# Patient Record
Sex: Male | Born: 1980
Health system: Southern US, Community
[De-identification: ages and names within clinical notes are randomized; demographics above are authoritative.]

## PROBLEM LIST (undated history)

## (undated) DIAGNOSIS — G4730 Sleep apnea, unspecified: Secondary | ICD-10-CM

---

## 2005-05-24 ENCOUNTER — Emergency Department: Payer: Self-pay | Admitting: Emergency Medicine

## 2008-01-19 ENCOUNTER — Emergency Department: Payer: Self-pay | Admitting: Emergency Medicine

## 2008-09-14 ENCOUNTER — Emergency Department: Payer: Self-pay | Admitting: Emergency Medicine

## 2010-10-19 ENCOUNTER — Emergency Department: Payer: Self-pay | Admitting: Emergency Medicine

## 2010-12-01 ENCOUNTER — Ambulatory Visit: Payer: Self-pay | Admitting: Internal Medicine

## 2015-05-17 ENCOUNTER — Emergency Department: Payer: Managed Care, Other (non HMO)

## 2015-05-17 ENCOUNTER — Emergency Department
Admission: EM | Admit: 2015-05-17 | Discharge: 2015-05-17 | Disposition: A | Discharge status: Home / Self Care | Payer: Managed Care, Other (non HMO) | Attending: Emergency Medicine | Admitting: Emergency Medicine

## 2015-05-17 ENCOUNTER — Encounter: Payer: Self-pay | Admitting: Emergency Medicine

## 2015-05-17 DIAGNOSIS — R05 Cough: Secondary | ICD-10-CM | POA: Diagnosis present

## 2015-05-17 DIAGNOSIS — J069 Acute upper respiratory infection, unspecified: Secondary | ICD-10-CM | POA: Insufficient documentation

## 2015-05-17 DIAGNOSIS — I10 Essential (primary) hypertension: Secondary | ICD-10-CM | POA: Insufficient documentation

## 2015-05-17 MED ORDER — GUAIFENESIN-CODEINE 100-10 MG/5ML PO SOLN: 10.0000 mL | ORAL | Status: DC | PRN

## 2015-05-17 MED ORDER — AZITHROMYCIN 250 MG PO TABS: ORAL_TABLET | ORAL | Status: DC

## 2015-05-17 NOTE — ED Provider Notes (Signed)
[facility] Emergency Department Provider Note  ____________________________________________  Time seen: Approximately 2:45 PM  I have reviewed the triage vital signs and the nursing notes.   HISTORY  Chief Complaint Cough and Nasal Congestion    HPI Brett Holmes is a 35 y.o. male recently finished a course of Amoxil for sinus infection and Fosamax for cough. Patient states that a couple days he started developing fevers with chills and coughing productively. Denies any body aches. Past medical history significant for hypertension not taking any medications this time. Symptoms seem to be results worsen when laying down and nothing seems to improve them.   History reviewed. No pertinent past medical history.  There are no active problems to display for this patient.   History reviewed. No pertinent past surgical history.  Current Outpatient Rx  Name  Route  Sig  Dispense  Refill  . azithromycin (ZITHROMAX Z-PAK) 250 MG tablet      Take 2 tablets (500 mg) on  Day 1,  followed by 1 tablet (250 mg) once daily on Days 2 through 5.   6 each   0   . guaiFENesin-codeine 100-10 MG/5ML syrup   Oral   Take 10 mLs by mouth every 4 (four) hours as needed for cough.   180 mL   0     Allergies Review of patient's allergies indicates no known allergies.  No family history on file.  Social History Social History  Substance Use Topics  . Smoking status: Never Smoker   . Smokeless tobacco: None  . Alcohol Use: Yes    Review of Systems Constitutional: Occasional fever/chills ENT: No sore throat. Cardiovascular: Denies chest pain. Respiratory: Patient's shortness of breath with cough productive. Gastrointestinal: No abdominal pain.  No nausea, no vomiting.  No diarrhea.  No constipation. Genitourinary: Negative for dysuria. Musculoskeletal: Negative for back pain. Skin: Negative for rash. Neurological: Negative for headaches, focal weakness or  numbness.  10-point ROS otherwise negative.  ____________________________________________   PHYSICAL EXAM:  VITAL SIGNS: ED Triage Vitals  Enc Vitals Group     BP 05/17/15 1401 159/98 mmHg     Pulse Rate 05/17/15 1401 113     Resp 05/17/15 1401 20     Temp 05/17/15 1401 98.4 F (36.9 C)     Temp Source 05/17/15 1401 Oral     SpO2 05/17/15 1401 97 %     Weight 05/17/15 1401 350 lb (158.759 kg)     Height 05/17/15 1401  (1.88 m)     Head Cir --      Peak Flow --      Pain Score 05/17/15 1402 3     Pain Loc --      Pain Edu? --      Excl. in GC? --     Constitutional: Alert and oriented. Well appearing and in no acute distress. Nose: Minimal congestion/rhinnorhea. Mouth/Throat: Mucous membranes are moist.  Oropharynx non-erythematous. Neck: No stridor.   Cardiovascular: Normal rate, regular rhythm. Grossly normal heart sounds.  Good peripheral circulation. Respiratory: Normal respiratory effort.  No retractions. Lungs CTAB. Decreased breath sounds bilaterally. May be due to poor inspiration effort. Musculoskeletal: No lower extremity tenderness nor edema.  No joint effusions. Neurologic:  Normal speech and language. No gross focal neurologic deficits are appreciated. No gait instability. Skin:  Skin is warm, dry and intact. No rash noted. Psychiatric: Mood and affect are normal. Speech and behavior are normal.  ____________________________________________   LABS (all labs  ordered are listed, but only abnormal results are displayed)  Labs Reviewed - No data to display ____________________________________________   RADIOLOGY  No acute cardiopulmonary disease. No evidence of atelectasis or pneumonia. ____________________________________________   PROCEDURES  Procedure(s) performed: None  Critical Care performed: No  ____________________________________________   INITIAL IMPRESSION / ASSESSMENT AND PLAN / ED COURSE  Pertinent labs & imaging results that  were available during my care of the patient were reviewed by me and considered in my medical decision making (see chart for details).  Acute upper respiratory infection. Rx given for Z-Pak, Robitussin-AC, patient to follow up PCP to monitor his elevated blood pressure which was discussed with him at length today. Patient voices no other emergency medical complaints at this time. Work excuse 48 hours given. ____________________________________________   FINAL CLINICAL IMPRESSION(S) / ED DIAGNOSES  Final diagnoses:  URI, acute     This chart was dictated using voice recognition software/Dragon. Despite best efforts to proofread, errors can occur which can change the meaning. Any change was purely unintentional.   Evangeline Dakin, PA-C 05/17/15 1540  Governor Rooks, MD 05/17/15 1626

## 2015-05-17 NOTE — Discharge Instructions (Signed)
Upper Respiratory Infection, Adult Most upper respiratory infections (URIs) are a viral infection of the air passages leading to the lungs. A URI affects the nose, throat, and upper air passages. The most common type of URI is nasopharyngitis and is typically referred to as "the common cold." URIs run their course and usually go away on their own. Most of the time, a URI does not require medical attention, but sometimes a bacterial infection in the upper airways can follow a viral infection. This is called a secondary infection. Sinus and middle ear infections are common types of secondary upper respiratory infections. Bacterial pneumonia can also complicate a URI. A URI can worsen asthma and chronic obstructive pulmonary disease (COPD). Sometimes, these complications can require emergency medical care and may be life threatening.  CAUSES Almost all URIs are caused by viruses. A virus is a type of germ and can spread from one person to another.  RISKS FACTORS You may be at risk for a URI if:   You smoke.   You have chronic heart or lung disease.  You have a weakened defense (immune) system.   You are very young or very old.   You have nasal allergies or asthma.  You work in crowded or poorly ventilated areas.  You work in health care facilities or schools. SIGNS AND SYMPTOMS  Symptoms typically develop 2-3 days after you come in contact with a cold virus. Most viral URIs last 7-10 days. However, viral URIs from the influenza virus (flu virus) can last 14-18 days and are typically more severe. Symptoms may include:   Runny or stuffy (congested) nose.   Sneezing.   Cough.   Sore throat.   Headache.   Fatigue.   Fever.   Loss of appetite.   Pain in your forehead, behind your eyes, and over your cheekbones (sinus pain).  Muscle aches.  DIAGNOSIS  Your health care provider may diagnose a URI by:  Physical exam.  Tests to check that your symptoms are not due to  another condition such as:  Strep throat.  Sinusitis.  Pneumonia.  Asthma. TREATMENT  A URI goes away on its own with time. It cannot be cured with medicines, but medicines may be prescribed or recommended to relieve symptoms. Medicines may help:  Reduce your fever.  Reduce your cough.  Relieve nasal congestion. HOME CARE INSTRUCTIONS   Take medicines only as directed by your health care provider.   Gargle warm saltwater or take cough drops to comfort your throat as directed by your health care provider.  Use a warm mist humidifier or inhale steam from a shower to increase air moisture. This may make it easier to breathe.  Drink enough fluid to keep your urine clear or pale yellow.   Eat soups and other clear broths and maintain good nutrition.   Rest as needed.   Return to work when your temperature has returned to normal or as your health care provider advises. You may need to stay home longer to avoid infecting others. You can also use a face mask and careful hand washing to prevent spread of the virus.  Increase the usage of your inhaler if you have asthma.   Do not use any tobacco products, including cigarettes, chewing tobacco, or electronic cigarettes. If you need help quitting, ask your health care provider. PREVENTION  The best way to protect yourself from getting a cold is to practice good hygiene.   Avoid oral or hand contact with people with cold   symptoms.   Wash your hands often if contact occurs.  There is no clear evidence that vitamin C, vitamin E, echinacea, or exercise reduces the chance of developing a cold. However, it is always recommended to get plenty of rest, exercise, and practice good nutrition.  SEEK MEDICAL CARE IF:   You are getting worse rather than better.   Your symptoms are not controlled by medicine.   You have chills.  You have worsening shortness of breath.  You have brown or red mucus.  You have yellow or brown nasal  discharge.  You have pain in your face, especially when you bend forward.  You have a fever.  You have swollen neck glands.  You have pain while swallowing.  You have white areas in the back of your throat. SEEK IMMEDIATE MEDICAL CARE IF:   You have severe or persistent:  Headache.  Ear pain.  Sinus pain.  Chest pain.  You have chronic lung disease and any of the following:  Wheezing.  Prolonged cough.  Coughing up blood.  A change in your usual mucus.  You have a stiff neck.  You have changes in your:  Vision.  Hearing.  Thinking.  Mood. MAKE SURE YOU:   Understand these instructions.  Will watch your condition.  Will get help right away if you are not doing well or get worse.   This information is not intended to replace advice given to you by your health care provider. Make sure you discuss any questions you have with your health care provider.   Document Released: 09/06/2000 Document Revised: 07/28/2014 Document Reviewed: 06/18/2013 Elsevier Interactive Patient Education 2016 [employer]  

## 2015-05-17 NOTE — ED Notes (Signed)
Pt presents with cough and congestion for one month, he is also wondering if he is allergic to his cats at home.

## 2015-05-17 NOTE — ED Notes (Signed)
States he has had congestion  Cough for several months   States sx's eased off and then returned about 2-3 weeks ago.  Congestion is now is chest

## 2015-10-25 ENCOUNTER — Emergency Department
Admission: EM | Admit: 2015-10-25 | Discharge: 2015-10-25 | Disposition: A | Discharge status: Home / Self Care | Payer: Worker's Compensation | Attending: Emergency Medicine | Admitting: Emergency Medicine

## 2015-10-25 ENCOUNTER — Emergency Department: Payer: Worker's Compensation

## 2015-10-25 ENCOUNTER — Encounter: Payer: Self-pay | Admitting: Emergency Medicine

## 2015-10-25 DIAGNOSIS — S60042A Contusion of left ring finger without damage to nail, initial encounter: Secondary | ICD-10-CM | POA: Diagnosis not present

## 2015-10-25 DIAGNOSIS — Y929 Unspecified place or not applicable: Secondary | ICD-10-CM | POA: Insufficient documentation

## 2015-10-25 DIAGNOSIS — W230XXA Caught, crushed, jammed, or pinched between moving objects, initial encounter: Secondary | ICD-10-CM | POA: Diagnosis not present

## 2015-10-25 DIAGNOSIS — S6992XA Unspecified injury of left wrist, hand and finger(s), initial encounter: Secondary | ICD-10-CM | POA: Diagnosis present

## 2015-10-25 DIAGNOSIS — Y939 Activity, unspecified: Secondary | ICD-10-CM | POA: Diagnosis not present

## 2015-10-25 DIAGNOSIS — S6000XA Contusion of unspecified finger without damage to nail, initial encounter: Secondary | ICD-10-CM

## 2015-10-25 DIAGNOSIS — Y990 Civilian activity done for income or pay: Secondary | ICD-10-CM | POA: Diagnosis not present

## 2015-10-25 HISTORY — DX: Sleep apnea, unspecified: G47.30

## 2015-10-25 MED ORDER — NAPROXEN 500 MG PO TABS
500.0000 mg | ORAL_TABLET | Freq: Once | ORAL | Status: AC
  Administered 2015-10-25: 500 mg via ORAL
  Filled 2015-10-25: qty 1

## 2015-10-25 MED ORDER — TRAMADOL HCL 50 MG PO TABS: 50.0000 mg | ORAL_TABLET | Freq: Once | ORAL | Status: DC

## 2015-10-25 MED ORDER — NAPROXEN 500 MG PO TABS: 500.0000 mg | ORAL_TABLET | Freq: Two times a day (BID) | ORAL | 0 refills | Status: DC

## 2015-10-25 NOTE — Discharge Instructions (Signed)
Wear finger splint for 2-3 days as needed. °

## 2015-10-25 NOTE — ED Notes (Signed)
Pt reports his finger got crushed in between 2 rollers in a machine at work at approx 2000.

## 2015-10-25 NOTE — ED Notes (Signed)
workmans comp urine drug screen done and walked to lab.

## 2015-10-25 NOTE — ED Provider Notes (Signed)
[facility] Emergency Department Provider Note   ____________________________________________   First MD Initiated Contact with Patient 10/25/15 2118     (approximate)  I have reviewed the triage vital signs and the nursing notes.   HISTORY  Chief Complaint Hand Pain    HPI Brett Holmes is a 35 y.o. male patient complain of pain and edema to the fourth digit left hand. Patient state his finger smashed between 2 rollers at work. Patient denies any loss sensation but state pain with flexion of the finger. Patient rates his pain as a 4/10. Patient described a pain as "throbbing". No palliative measures taken for this complaint.Patient is right-hand dominant.   Past Medical History:  Diagnosis Date  . Sleep apnea     There are no active problems to display for this patient.   History reviewed. No pertinent surgical history.  Prior to Admission medications   Medication Sig Start Date End Date Taking? Authorizing Provider  azithromycin (ZITHROMAX Z-PAK) 250 MG tablet Take 2 tablets (500 mg) on  Day 1,  followed by 1 tablet (250 mg) once daily on Days 2 through 5. 05/17/15   Evangeline Dakin, PA-C  guaiFENesin-codeine 100-10 MG/5ML syrup Take 10 mLs by mouth every 4 (four) hours as needed for cough. 05/17/15   Evangeline Dakin, PA-C  naproxen (NAPROSYN) 500 MG tablet Take 1 tablet (500 mg total) by mouth 2 (two) times daily with a meal. 10/25/15   Joni Reining, PA-C    Allergies Review of patient's allergies indicates no known allergies.  No family history on file.  Social History Social History  Substance Use Topics  . Smoking status: Never Smoker  . Smokeless tobacco: Never Used  . Alcohol use Yes     Comment: occ    Review of Systems Constitutional: No fever/chills Eyes: No visual changes. ENT: No sore throat. Cardiovascular: Denies chest pain. Respiratory: Denies shortness of breath. Gastrointestinal: No abdominal pain.  No nausea, no  vomiting.  No diarrhea.  No constipation. Genitourinary: Negative for dysuria. Musculoskeletal: Pain and edema fourth digit right hand. Skin: Negative for rash. Neurological: Negative for headaches, focal weakness or numbness.    ____________________________________________   PHYSICAL EXAM:  VITAL SIGNS: ED Triage Vitals [10/25/15 2025]  Enc Vitals Group     BP (!) 154/96     Pulse Rate (!) 117     Resp 20     Temp 97.9 F (36.6 C)     Temp Source Oral     SpO2 94 %     Weight (!) 340 lb (154.2 kg)     Height 6\' 2"  (1.88 m)     Head Circumference      Peak Flow      Pain Score 5     Pain Loc      Pain Edu?      Excl. in GC?     Constitutional: Alert and oriented. Well appearing and in no acute distress. Eyes: Conjunctivae are normal. PERRL. EOMI. Head: Atraumatic. Nose: No congestion/rhinnorhea. Mouth/Throat: Mucous membranes are moist.  Oropharynx non-erythematous. Neck: No stridor.  No cervical spine tenderness to palpation. Hematological/Lymphatic/Immunilogical: No cervical lymphadenopathy. Cardiovascular: Normal rate, regular rhythm. Grossly normal heart sounds.  Good peripheral circulation.Mildly elevated blood pressure Respiratory: Normal respiratory effort.  No retractions. Lungs CTAB. Gastrointestinal: Soft and nontender. No distention. No abdominal bruits. No CVA tenderness. Musculoskeletal: No obvious deformity to the fourth digit left hand. Moderate edema. Neurovascular intact. Decreased range of motion  with flexion limited by complaining of pain.  Neurologic:  Normal speech and language. No gross focal neurologic deficits are appreciated. No gait instability. Skin:  Skin is warm, dry and intact. No rash noted. Psychiatric: Mood and affect are normal. Speech and behavior are normal.  ____________________________________________   LABS (all labs ordered are listed, but only abnormal results are displayed)  Labs Reviewed - No data to  display ____________________________________________  EKG   ____________________________________________  RADIOLOGY  No acute findings on x-ray. I, Joni Reining, personally viewed and evaluated these images (plain radiographs) as part of my medical decision making, as well as reviewing the written report by the radiologist.  ____________________________________________   PROCEDURES  Procedure(s) performed: None  Procedures  Critical Care performed: No  ____________________________________________   INITIAL IMPRESSION / ASSESSMENT AND PLAN / ED COURSE  Pertinent labs & imaging results that were available during my care of the patient were reviewed by me and considered in my medical decision making (see chart for details).  Contusion fourth digit left hand. Discussed negative x-ray finding with patient. Patient placed in a finger splint. Patient given discharge care instructions. Patient given a prescription for naproxen. Patient advised follow-up if condition worsens.  Clinical Course     ____________________________________________   FINAL CLINICAL IMPRESSION(S) / ED DIAGNOSES  Final diagnoses:  Finger contusion, initial encounter      NEW MEDICATIONS STARTED DURING THIS VISIT:  New Prescriptions   NAPROXEN (NAPROSYN) 500 MG TABLET    Take 1 tablet (500 mg total) by mouth 2 (two) times daily with a meal.     Note:  This document was prepared using Dragon voice recognition software and may include unintentional dictation errors.    Joni Reining, PA-C 10/25/15 2129    Loleta Rose, MD 10/26/15 [phone]

## 2015-10-25 NOTE — ED Triage Notes (Signed)
Patient states that he was at work and smashed his fingers between two rollers.

## 2016-07-28 ENCOUNTER — Emergency Department: Payer: Managed Care, Other (non HMO)

## 2016-07-28 ENCOUNTER — Emergency Department
Admission: EM | Admit: 2016-07-28 | Discharge: 2016-07-28 | Disposition: A | Discharge status: Home / Self Care | Payer: Managed Care, Other (non HMO) | Attending: Emergency Medicine | Admitting: Emergency Medicine

## 2016-07-28 DIAGNOSIS — Z79899 Other long term (current) drug therapy: Secondary | ICD-10-CM | POA: Insufficient documentation

## 2016-07-28 DIAGNOSIS — R0789 Other chest pain: Secondary | ICD-10-CM

## 2016-07-28 LAB — BASIC METABOLIC PANEL
ANION GAP: 8 (ref 5–15)
BUN: 13 mg/dL (ref 6–20)
CO2: 28 mmol/L (ref 22–32)
Calcium: 9.5 mg/dL (ref 8.9–10.3)
Chloride: 104 mmol/L (ref 101–111)
Creatinine, Ser: 0.9 mg/dL (ref 0.61–1.24)
GFR calc non Af Amer: >60 mL/min (ref >60)
Glucose, Bld: 92 mg/dL (ref 65–99)
POTASSIUM: 4.5 mmol/L (ref 3.5–5.1)
Sodium: 140 mmol/L (ref 135–145)

## 2016-07-28 LAB — CBC
HEMATOCRIT: 45.7 % (ref 40.0–52.0)
HEMOGLOBIN: 15.5 g/dL (ref 13.0–18.0)
MCH: 29.7 pg (ref 26.0–34.0)
MCHC: 33.9 g/dL (ref 32.0–36.0)
MCV: 87.8 fL (ref 80.0–100.0)
Platelets: 353 10*3/uL (ref 150–440)
RBC: 5.2 MIL/uL (ref 4.40–5.90)
RDW: 13.4 % (ref 11.5–14.5)
WBC: 10.5 10*3/uL (ref 3.8–10.6)

## 2016-07-28 LAB — TROPONIN I: Troponin I: <0.03 ng/mL (ref <0.03)

## 2016-07-28 MED ORDER — NAPROXEN 500 MG PO TABS: 500.0000 mg | ORAL_TABLET | Freq: Two times a day (BID) | ORAL | 2 refills | Status: DC

## 2016-07-28 MED ORDER — KETOROLAC TROMETHAMINE 30 MG/ML IJ SOLN
30.0000 mg | Freq: Once | INTRAMUSCULAR | Status: AC
  Administered 2016-07-28: 30 mg via INTRAMUSCULAR
  Filled 2016-07-28: qty 1

## 2016-07-28 NOTE — ED Triage Notes (Signed)
Pt reports left side non radiating chest pain and SOB that began today.

## 2016-07-28 NOTE — ED Notes (Signed)
Pt reports pain is better overall and is now only dull.  Pt sitting with family.

## 2016-07-28 NOTE — ED Notes (Signed)
ED Provider at bedside. 

## 2016-07-28 NOTE — ED Provider Notes (Signed)
[facility] Emergency Department Provider Note   ____________________________________________    I have reviewed the triage vital signs and the nursing notes.   HISTORY  Chief Complaint Chest Pain     HPI Brett Holmes is a 36 y.o. male who presents with complaints of chest pain. Patient reports chest pain developed this morning and mostly resolved by approximately 2 pm. At the time he felt like his breathing was more labored than typical but he reports his breathing is always labored. He has been source CPAP but not does not do so. He has outpatient sleep study pending. Currently feels well but does still have a dull aching in the left lateral chest, this is worse with moving his arm. No fevers or chills. No cough. No nausea or vomiting or diaphoresis. No recent travel. No calf pain or swelling.   Past Medical History:  Diagnosis Date  . Sleep apnea     There are no active problems to display for this patient.   No past surgical history on file.  Prior to Admission medications   Medication Sig Start Date End Date Taking? Authorizing Provider  azithromycin (ZITHROMAX Z-PAK) 250 MG tablet Take 2 tablets (500 mg) on  Day 1,  followed by 1 tablet (250 mg) once daily on Days 2 through 5. 05/17/15   Evangeline Dakin, PA-C  guaiFENesin-codeine 100-10 MG/5ML syrup Take 10 mLs by mouth every 4 (four) hours as needed for cough. 05/17/15   Charmayne Sheer Beers, PA-C  naproxen (NAPROSYN) 500 MG tablet Take 1 tablet (500 mg total) by mouth 2 (two) times daily with a meal. 07/28/16   Jene Every, MD     Allergies Patient has no known allergies.  No family history on file.  Social History Social History  Substance Use Topics  . Smoking status: Never Smoker  . Smokeless tobacco: Never Used  . Alcohol use Yes     Comment: occ    Review of Systems  Constitutional: No fever/chills Eyes: No visual changes.  ENT: No sore throat. Cardiovascular: As  above Respiratory: Denies shortness of breath. Gastrointestinal: No abdominal pain.  No nausea, no vomiting.   Genitourinary: Negative for dysuria. Musculoskeletal: Negative for back pain. Skin: Negative for rash. Neurological: Negative for headaches    ____________________________________________   PHYSICAL EXAM:  VITAL SIGNS: ED Triage Vitals  Enc Vitals Group     BP 07/28/16 1521 123/82     Pulse Rate 07/28/16 1521 89     Resp 07/28/16 1521 16     Temp 07/28/16 1521 98.5 F (36.9 C)     Temp Source 07/28/16 1521 Oral     SpO2 07/28/16 1521 95 %     Weight 07/28/16 1521 (!) 335 lb (152 kg)     Height 07/28/16 1521 6\' 2"  (1.88 m)     Head Circumference --      Peak Flow --      Pain Score 07/28/16 1536 4     Pain Loc --      Pain Edu? --      Excl. in GC? --     Constitutional: Alert and oriented. No acute distress. Pleasant and interactive Eyes: Conjunctivae are normal.  Head: Atraumatic. Nose: No congestion/rhinnorhea. Mouth/Throat: Mucous membranes are moist.   Neck:  Painless ROM Cardiovascular: Normal rate, regular rhythm. Grossly normal heart sounds.  Good peripheral circulation.Chest wall tenderness at approximately the third fourth rib lateral to the sternum, no rash or discoloration or  bony abnormality  Respiratory: Normal respiratory effort.  No retractions. Lungs CTAB. Gastrointestinal: Soft and nontender. No distention.  No CVA tenderness. Genitourinary: deferred Musculoskeletal: No lower extremity tenderness nor edema.  Warm and well perfused Neurologic:  Normal speech and language. No gross focal neurologic deficits are appreciated.  Skin:  Skin is warm, dry and intact. No rash noted. Psychiatric: Mood and affect are normal. Speech and behavior are normal.  ____________________________________________   LABS (all labs ordered are listed, but only abnormal results are displayed)  Labs Reviewed  BASIC METABOLIC PANEL  CBC  TROPONIN I    ____________________________________________  EKG  ED ECG REPORT I, Jene EveryKINNER, Mavric Cortright, the attending physician, personally viewed and interpreted this ECG.  Date: 07/28/2016  Rhythm: normal sinus rhythm QRS Axis: normal Intervals: normal ST/T Wave abnormalities: normal Conduction Disturbances: none Narrative Interpretation: unremarkable  ____________________________________________  RADIOLOGY  Chest x-ray unremarkable ____________________________________________   PROCEDURES  Procedure(s) performed: No    Critical Care performed: No ____________________________________________   INITIAL IMPRESSION / ASSESSMENT AND PLAN / ED COURSE  Pertinent labs & imaging results that were available during my care of the patient were reviewed by me and considered in my medical decision making (see chart for details).  Patient results with complaints of chest pain. This appears to be chest wall pain given it worsens with range of motion of the left arm and he is point tender in the area. Lab work and EKG are greatly reassuring. Chest x-ray is normal. Not consistent with ACS, dissection, myocarditis, pericarditis. We will treat conservatively with Naprosyn and outpatient follow-up. Return precautions discussed    ____________________________________________   FINAL CLINICAL IMPRESSION(S) / ED DIAGNOSES  Final diagnoses:  Chest wall pain      NEW MEDICATIONS STARTED DURING THIS VISIT:  Discharge Medication List as of 07/28/2016  6:55 PM       Note:  This document was prepared using Dragon voice recognition software and may include unintentional dictation errors.    Jene Everyobert Cairo Lingenfelter, MD 07/28/16 [phone]

## 2016-12-05 ENCOUNTER — Emergency Department: Payer: Managed Care, Other (non HMO)

## 2016-12-05 ENCOUNTER — Other Ambulatory Visit: Payer: Self-pay

## 2016-12-05 ENCOUNTER — Emergency Department
Admission: EM | Admit: 2016-12-05 | Discharge: 2016-12-05 | Disposition: A | Discharge status: Home / Self Care | Payer: Managed Care, Other (non HMO) | Attending: Emergency Medicine | Admitting: Emergency Medicine

## 2016-12-05 DIAGNOSIS — R519 Headache, unspecified: Secondary | ICD-10-CM

## 2016-12-05 DIAGNOSIS — R42 Dizziness and giddiness: Secondary | ICD-10-CM | POA: Diagnosis present

## 2016-12-05 DIAGNOSIS — R51 Headache: Secondary | ICD-10-CM | POA: Insufficient documentation

## 2016-12-05 DIAGNOSIS — H538 Other visual disturbances: Secondary | ICD-10-CM | POA: Insufficient documentation

## 2016-12-05 DIAGNOSIS — G4730 Sleep apnea, unspecified: Secondary | ICD-10-CM | POA: Diagnosis not present

## 2016-12-05 DIAGNOSIS — H5500 Unspecified nystagmus: Secondary | ICD-10-CM | POA: Insufficient documentation

## 2016-12-05 LAB — APTT: aPTT: 27 seconds (ref 24–36)

## 2016-12-05 LAB — URINALYSIS, COMPLETE (UACMP) WITH MICROSCOPIC
Bacteria, UA: NONE SEEN
Bilirubin Urine: NEGATIVE
GLUCOSE, UA: NEGATIVE mg/dL
Hgb urine dipstick: NEGATIVE
Ketones, ur: NEGATIVE mg/dL
Leukocytes, UA: NEGATIVE
Nitrite: NEGATIVE
PH: 8 (ref 5.0–8.0)
Protein, ur: NEGATIVE mg/dL
Specific Gravity, Urine: 1.019 (ref 1.005–1.030)
Squamous Epithelial / LPF: NONE SEEN
WBC, UA: NONE SEEN WBC/hpf (ref 0–5)

## 2016-12-05 LAB — DIFFERENTIAL
BASOS ABS: 0.1 10*3/uL (ref 0–0.1)
Basophils Relative: 1 %
EOS ABS: 0.1 10*3/uL (ref 0–0.7)
Eosinophils Relative: 1 %
LYMPHS ABS: 3 10*3/uL (ref 1.0–3.6)
LYMPHS PCT: 26 %
Monocytes Absolute: 0.9 10*3/uL (ref 0.2–1.0)
Monocytes Relative: 7 %
NEUTROS PCT: 65 %
Neutro Abs: 7.6 10*3/uL — ABNORMAL HIGH (ref 1.4–6.5)

## 2016-12-05 LAB — GLUCOSE, CAPILLARY: Glucose-Capillary: 108 mg/dL — ABNORMAL HIGH (ref 65–99)

## 2016-12-05 LAB — COMPREHENSIVE METABOLIC PANEL
ALBUMIN: 4 g/dL (ref 3.5–5.0)
ALK PHOS: 98 U/L (ref 38–126)
ALT: 24 U/L (ref 17–63)
AST: 22 U/L (ref 15–41)
Anion gap: 9 (ref 5–15)
BILIRUBIN TOTAL: 0.4 mg/dL (ref 0.3–1.2)
BUN: 13 mg/dL (ref 6–20)
CALCIUM: 9.5 mg/dL (ref 8.9–10.3)
CHLORIDE: 104 mmol/L (ref 101–111)
CO2: 27 mmol/L (ref 22–32)
CREATININE: 1 mg/dL (ref 0.61–1.24)
GFR calc non Af Amer: >60 mL/min (ref >60)
Glucose, Bld: 114 mg/dL — ABNORMAL HIGH (ref 65–99)
Potassium: 4.2 mmol/L (ref 3.5–5.1)
SODIUM: 140 mmol/L (ref 135–145)
Total Protein: 7.3 g/dL (ref 6.5–8.1)

## 2016-12-05 LAB — CBC
HCT: 47 % (ref 40.0–52.0)
HEMOGLOBIN: 15.7 g/dL (ref 13.0–18.0)
MCH: 29.3 pg (ref 26.0–34.0)
MCHC: 33.5 g/dL (ref 32.0–36.0)
MCV: 87.7 fL (ref 80.0–100.0)
PLATELETS: 371 10*3/uL (ref 150–440)
RBC: 5.36 MIL/uL (ref 4.40–5.90)
RDW: 13.4 % (ref 11.5–14.5)
WBC: 11.7 10*3/uL — ABNORMAL HIGH (ref 3.8–10.6)

## 2016-12-05 LAB — PROTIME-INR
INR: 1.02
PROTHROMBIN TIME: 13.3 s (ref 11.4–15.2)

## 2016-12-05 LAB — TROPONIN I

## 2016-12-05 MED ORDER — IOPAMIDOL (ISOVUE-370) INJECTION 76%
75.0000 mL | Freq: Once | INTRAVENOUS | Status: AC | PRN
  Administered 2016-12-05: 75 mL via INTRAVENOUS

## 2016-12-05 MED ORDER — SODIUM CHLORIDE 0.9 % IV BOLUS (SEPSIS)
1000.0000 mL | Freq: Once | INTRAVENOUS | Status: AC
  Administered 2016-12-05: 1000 mL via INTRAVENOUS

## 2016-12-05 MED ORDER — ACETAMINOPHEN 500 MG PO TABS
1000.0000 mg | ORAL_TABLET | Freq: Once | ORAL | Status: AC
  Administered 2016-12-05: 1000 mg via ORAL
  Filled 2016-12-05: qty 2

## 2016-12-05 NOTE — ED Notes (Signed)
CODE STROKE CALLED TO [id number] 

## 2016-12-05 NOTE — ED Provider Notes (Signed)
[facility] Emergency Department Provider Note  ____________________________________________  Time seen: Approximately 3:24 PM  I have reviewed the triage vital signs and the nursing notes.   HISTORY  Chief Complaint Dizziness    HPI Montague Corella is a 36 y.o. male history of obesity and sleep apnea presenting with lightheadedness and mild headache. The patient reports that around 12:30, he developed a lightheaded sensation with some room spinning sensation. He then progressively developed a mild headache; he states this is not the worst headache of his life. He has had some intermittent mild blurred vision, but no double vision. No numbness tingling or weakness. He did feel that it was difficult to walk because of his lightheadedness.the patient states he has had episodes similar to this in the past, but this one is more severe. No trauma, fevers, chills, stiff neck. No recent cough or cold symptoms.  Social history: Denies tobacco or cocaine    Past Medical History:  Diagnosis Date  . Sleep apnea     There are no active problems to display for this patient.   History reviewed. No pertinent surgical history.  Current Outpatient Rx  . Order #: [id number] Class: Historical Med  . Order #: [id number] Class: Print  . Order #: [id number] Class: Print    Allergies Patient has no known allergies.  No family history on file.  Social History Social History  Substance Use Topics  . Smoking status: Never Smoker  . Smokeless tobacco: Never Used  . Alcohol use Yes     Comment: occ    Review of Systems Constitutional: No fever/chills.positive lightheadedness and dizziness. No syncope. Eyes: mild intermittent blurred vision. No diplopia. ENT: No sore throat. No congestion or rhinorrhea. Cardiovascular: Denies chest pain. Denies palpitations. Respiratory: Denies shortness of breath.  No cough. Gastrointestinal: No abdominal pain.  No nausea, no vomiting.  No  diarrhea.  No constipation. Genitourinary: Negative for dysuria. Musculoskeletal: Negative for back pain. Skin: Negative for rash. Neurological: positive for mild headache. No focal numbness, tingling or weakness. No facial and symmetric symmetry. Positive blurred vision. No changes in speech. No changes in mental status. Positive difficulty walking due to lightheadedness and dizziness.    ____________________________________________   PHYSICAL EXAM:  VITAL SIGNS: ED Triage Vitals  Enc Vitals Group     BP 12/05/16 1459 138/88     Pulse Rate 12/05/16 1459 95     Resp 12/05/16 1459 18     Temp 12/05/16 1459 98.5 F (36.9 C)     Temp Source 12/05/16 1459 Oral     SpO2 12/05/16 1459 98 %     Weight 12/05/16 1459 (!) 303 lb (137.4 kg)     Height 12/05/16 1459  (1.88 m)     Head Circumference --      Peak Flow --      Pain Score 12/05/16 1457 4     Pain Loc --      Pain Edu? --      Excl. in GC? --     Constitutional: Alert and oriented. Mildly uncomfortable appearing but in no acute distress. Answers questions appropriately. Eyes: Conjunctivae are normal.  EOMI. PERRLA. 2 beats of horizontal nystagmus when asked to look laterally to the left which extinguish on their own. No horizontal nystagmus.No scleral icterus. Head: Atraumatic. Nose: No congestion/rhinnorhea. Mouth/Throat: Mucous membranes are moist.  Neck: No stridor.  Supple.  No JVD. No meningismus. Cardiovascular: Normal rate, regular rhythm. No murmurs, rubs or gallops.  Respiratory: Normal respiratory  effort.  No accessory muscle use or retractions. Lungs CTAB.  No wheezes, rales or ronchi. Gastrointestinal: obese.Soft, nontender and nondistended.  No guarding or rebound.  No peritoneal signs. Musculoskeletal: No LE edema. No ttp in the calves or palpable cords.  Negative Homan's sign. Neurologic: Alert and oriented 3. Speech is clear.  Face and smile symmetric. Tongue is midline. EOMI. PERRLA. 2 beats of  horizontal nystagmus with the lateral left gaze. No pronator drift. 5 out of 5 grip, biceps, triceps, hip flexors, plantar flexion and dorsiflexion. Normal sensation to light touch in the bilateral upper and lower extremities, and face. Normal heel-to-shin. Skin:  Skin is warm, dry and intact. No rash noted. Psychiatric: Mood and affect are normal. Speech and behavior are normal.  Normal judgement.  ____________________________________________   LABS (all labs ordered are listed, but only abnormal results are displayed)  Labs Reviewed  CBC - Abnormal; Notable for the following:       Result Value   WBC 11.7 (*)    All other components within normal limits  DIFFERENTIAL - Abnormal; Notable for the following:    Neutro Abs 7.6 (*)    All other components within normal limits  COMPREHENSIVE METABOLIC PANEL - Abnormal; Notable for the following:    Glucose, Bld 114 (*)    All other components within normal limits  URINALYSIS, COMPLETE (UACMP) WITH MICROSCOPIC - Abnormal; Notable for the following:    Color, Urine YELLOW (*)    APPearance CLOUDY (*)    All other components within normal limits  GLUCOSE, CAPILLARY - Abnormal; Notable for the following:    Glucose-Capillary 108 (*)    All other components within normal limits  PROTIME-INR  APTT  TROPONIN I  CBG MONITORING, ED   ____________________________________________  EKG  ED ECG REPORT I, Rockne MenghiniNorman, Anne-Caroline, the attending physician, personally viewed and interpreted this ECG.   Date: 12/05/2016  EKG Time: 1513  Rate: 100  Rhythm: sinus tachycardia  Axis: normal  Intervals:none  ST&T Change: RSR'. No STEMI.  ____________________________________________  RADIOLOGY  Ct Angio Head W Or Wo Contrast  Result Date: 12/05/2016 CLINICAL DATA:  36 year old male with sudden onset headache, dizziness and nausea at 1230 hours today. EXAM: CT ANGIOGRAPHY HEAD AND NECK TECHNIQUE: Multidetector CT imaging of the head and neck  was performed using the standard protocol during bolus administration of intravenous contrast. Multiplanar CT image reconstructions and MIPs were obtained to evaluate the vascular anatomy. Carotid stenosis measurements (when applicable) are obtained utilizing NASCET criteria, using the distal internal carotid diameter as the denominator. CONTRAST:  75 mL Isovue 370 COMPARISON:  Head CT without contrast 1504 hours today. FINDINGS: CTA NECK Skeleton: Negative visualized osseous structures. Visualized paranasal sinuses and mastoids are stable and well pneumatized. Upper chest: Lipomatosis in the visible mediastinum. No superior mediastinal lymphadenopathy. Negative visualized lung parenchyma aside from mild atelectasis. Other neck: Negative.  No cervical lymphadenopathy. Aortic arch: 3 vessel arch configuration. No arch atherosclerosis or great vessel origin stenosis. Right carotid system: Negative right CCA and right carotid bifurcation. Negative cervical right ICA. Left carotid system: Negative left CCA. Negative left carotid bifurcation and cervical left ICA. Vertebral arteries:No proximal subclavian artery plaque or stenosis. Normal right vertebral artery origin. Suboptimal contrast bolus in the proximal right vertebral artery, but the vessel appears to be patent and within normal limits to the skullbase. Normal left vertebral artery origin. Mildly tortuous left V1 segment. Similar suboptimal contrast bolus in the proximal left V 2 segment, but the left  vertebral artery appears to be patent and within normal limits to the skullbase. CTA HEAD Posterior circulation: Codominant distal vertebral arteries are patent to the vertebrobasilar junction without stenosis. Normal right PICA origin. Dominant appearing left AICA. No basilar stenosis. Mild basilar tortuosity. SCA and PCA origins are within normal limits. Posterior communicating arteries are diminutive. Bilateral PCA branches are within normal limits. Anterior  circulation: Both ICA siphons are patent and appear normal. Normal ophthalmic and posterior communicating artery origins. Carotid termini, MCA and ACA origins appear normal. Diminutive anterior communicating artery. Bilateral ACA branches are within normal limits. Left MCA M1 segment, left MCA trifurcation, and left MCA branches are within normal limits. Right MCA M1 segment, bifurcation, and right MCA branches are within normal limits. Venous sinuses: Patent. Anatomic variants: None. Review of the MIP images confirms the above findings IMPRESSION: Negative CTA head and neck. No neck arterial or intracranial vascular abnormality identified. Electronically Signed   By: Odessa Fleming M.D.   On: 12/05/2016 16:34   Ct Angio Neck W And/or Wo Contrast  Result Date: 12/05/2016 CLINICAL DATA:  36 year old male with sudden onset headache, dizziness and nausea at 1230 hours today. EXAM: CT ANGIOGRAPHY HEAD AND NECK TECHNIQUE: Multidetector CT imaging of the head and neck was performed using the standard protocol during bolus administration of intravenous contrast. Multiplanar CT image reconstructions and MIPs were obtained to evaluate the vascular anatomy. Carotid stenosis measurements (when applicable) are obtained utilizing NASCET criteria, using the distal internal carotid diameter as the denominator. CONTRAST:  75 mL Isovue 370 COMPARISON:  Head CT without contrast 1504 hours today. FINDINGS: CTA NECK Skeleton: Negative visualized osseous structures. Visualized paranasal sinuses and mastoids are stable and well pneumatized. Upper chest: Lipomatosis in the visible mediastinum. No superior mediastinal lymphadenopathy. Negative visualized lung parenchyma aside from mild atelectasis. Other neck: Negative.  No cervical lymphadenopathy. Aortic arch: 3 vessel arch configuration. No arch atherosclerosis or great vessel origin stenosis. Right carotid system: Negative right CCA and right carotid bifurcation. Negative cervical right  ICA. Left carotid system: Negative left CCA. Negative left carotid bifurcation and cervical left ICA. Vertebral arteries:No proximal subclavian artery plaque or stenosis. Normal right vertebral artery origin. Suboptimal contrast bolus in the proximal right vertebral artery, but the vessel appears to be patent and within normal limits to the skullbase. Normal left vertebral artery origin. Mildly tortuous left V1 segment. Similar suboptimal contrast bolus in the proximal left V 2 segment, but the left vertebral artery appears to be patent and within normal limits to the skullbase. CTA HEAD Posterior circulation: Codominant distal vertebral arteries are patent to the vertebrobasilar junction without stenosis. Normal right PICA origin. Dominant appearing left AICA. No basilar stenosis. Mild basilar tortuosity. SCA and PCA origins are within normal limits. Posterior communicating arteries are diminutive. Bilateral PCA branches are within normal limits. Anterior circulation: Both ICA siphons are patent and appear normal. Normal ophthalmic and posterior communicating artery origins. Carotid termini, MCA and ACA origins appear normal. Diminutive anterior communicating artery. Bilateral ACA branches are within normal limits. Left MCA M1 segment, left MCA trifurcation, and left MCA branches are within normal limits. Right MCA M1 segment, bifurcation, and right MCA branches are within normal limits. Venous sinuses: Patent. Anatomic variants: None. Review of the MIP images confirms the above findings IMPRESSION: Negative CTA head and neck. No neck arterial or intracranial vascular abnormality identified. Electronically Signed   By: Odessa Fleming M.D.   On: 12/05/2016 16:34   Ct Head Code Stroke Wo Contrast  Result Date: 12/05/2016 CLINICAL DATA:  Code stroke. Sudden onset of headaches. Dizziness and nausea. EXAM: CT HEAD WITHOUT CONTRAST TECHNIQUE: Contiguous axial images were obtained from the base of the skull through the  vertex without intravenous contrast. COMPARISON:  None. FINDINGS: Brain: No acute infarct, hemorrhage, or mass lesion is present. The ventricles are of normal size. No significant extraaxial fluid collection is present. The brainstem and cerebellum are normal. Vascular: No hyperdense vessel or unexpected calcification. Skull: Calvarium is intact. No focal lytic or blastic lesions are present. Sinuses/Orbits: The paranasal sinuses and mastoid air cells are clear. Globes and orbits are within normal limits. ASPECTS [facility] Stroke Program Early CT Score) - Ganglionic level infarction (caudate, lentiform nuclei, internal capsule, insula, M1-M3 cortex): 7/7 - Supraganglionic infarction (M4-M6 cortex): 3/3 Total score (0-10 with 10 being normal): 10/10 IMPRESSION: 1. Negative CT of the head. 2. ASPECTS is 10/10 These results were called by telephone at the time of interpretation on 12/05/2016 at 3:23 pm to Dr. Daryel November , who verbally acknowledged these results. Electronically Signed   By: Marin Roberts M.D.   On: 12/05/2016 15:23    ____________________________________________   PROCEDURES  Procedure(s) performed: None  Procedures  Critical Care performed: No ____________________________________________   INITIAL IMPRESSION / ASSESSMENT AND PLAN / ED COURSE  Pertinent labs & imaging results that were available during my care of the patient were reviewed by me and considered in my medical decision making (see chart for details).  36 y.o. Male with a history of obesity but otherwisevery few vascular risk factors presenting with lightheadedness and dizziness associated with mild headache and mild blurred vision. On arrival to the emergency department, the patient's blood sugar is within normal limits. His blood pressure is mildly elevated initially but then normalizes without intervention. He does have a headache but it is not the worse headache of his life. Subarachnoid hemorrhageor  intracranial bleed is very unlikely. CVA is also unlikely, but given the sudden onset of his symptoms, will do a full evaluation for stroke, including posterior stroke.telemetry neurology consult has been initiated. The patient has undergone CT, and will also undergo CT angiogram. We will also look for other causes of his symptoms including atypical migraine, headache, hypovolemia, anemia, or electrolyte disturbance, or symptomatic hypertension.plan reevaluation for final disposition.  ____________________________________________  FINAL CLINICAL IMPRESSION(S) / ED DIAGNOSES  Final diagnoses:  Dizziness  Lightheadedness  Mild headache  Blurred vision, bilateral         NEW MEDICATIONS STARTED DURING THIS VISIT:  Discharge Medication List as of 12/05/2016  6:33 PM        Rockne Menghini, MD 12/05/16 2139

## 2016-12-05 NOTE — Discharge Instructions (Signed)
Please drink plenty of fluids throughout the day and eat small regular meals. Plenty of rest, and make sure you take time between position changes, such as going from sitting to standing. His make a follow-up appointment with your regular doctor.  Return to the emergency department a few about lightheadedness, dizziness, fainting, fever, severe headache, numbness tingling or weakness, inability to walk, changes in speech or vision, or any other symptoms concerning to him.

## 2016-12-05 NOTE — ED Notes (Addendum)
tele neurologist MD Duffy on call at this time

## 2016-12-05 NOTE — Progress Notes (Signed)
Chaplain responded to a code stroke page for pt in ED Rm08. CH met with pt and wife who was at bedside. Dc and nurse were assessing pt at the time of this visit. Pt and wife states they did not need pastoral care services at this time. [place] provided a ministry of comfort and pastoral presence. [place] to follow up with pt as needed.   12/05/16 1528  Clinical Encounter Type  Visited With Patient and family together  Visit Type Initial;Spiritual support;Code;Other (Comment)  Referral From Nurse  Consult/Referral To [place] (Comment)

## 2016-12-05 NOTE — ED Triage Notes (Signed)
Pt c/o sudden onset of HA with dizziness and nausea at 1230pm

## 2017-05-29 DIAGNOSIS — Z0000 Encounter for general adult medical examination without abnormal findings: Secondary | ICD-10-CM | POA: Diagnosis not present

## 2017-05-29 DIAGNOSIS — G4733 Obstructive sleep apnea (adult) (pediatric): Secondary | ICD-10-CM | POA: Diagnosis not present

## 2017-05-29 DIAGNOSIS — E669 Obesity, unspecified: Secondary | ICD-10-CM | POA: Diagnosis not present

## 2017-05-31 DIAGNOSIS — Z0000 Encounter for general adult medical examination without abnormal findings: Secondary | ICD-10-CM | POA: Diagnosis not present

## 2017-05-31 DIAGNOSIS — E781 Pure hyperglyceridemia: Secondary | ICD-10-CM | POA: Diagnosis not present

## 2017-07-10 DIAGNOSIS — G4733 Obstructive sleep apnea (adult) (pediatric): Secondary | ICD-10-CM | POA: Diagnosis not present

## 2017-07-10 DIAGNOSIS — Z87898 Personal history of other specified conditions: Secondary | ICD-10-CM | POA: Diagnosis not present

## 2018-03-04 DIAGNOSIS — R05 Cough: Secondary | ICD-10-CM | POA: Diagnosis not present

## 2018-03-04 DIAGNOSIS — J019 Acute sinusitis, unspecified: Secondary | ICD-10-CM | POA: Diagnosis not present

## 2018-06-25 DIAGNOSIS — J019 Acute sinusitis, unspecified: Secondary | ICD-10-CM | POA: Diagnosis not present

## 2018-06-25 DIAGNOSIS — R0982 Postnasal drip: Secondary | ICD-10-CM | POA: Diagnosis not present

## 2018-07-03 DIAGNOSIS — R05 Cough: Secondary | ICD-10-CM | POA: Diagnosis not present

## 2018-10-22 ENCOUNTER — Other Ambulatory Visit: Payer: Self-pay | Admitting: Neurology

## 2018-10-22 DIAGNOSIS — R569 Unspecified convulsions: Secondary | ICD-10-CM

## 2018-10-25 ENCOUNTER — Ambulatory Visit
Admission: RE | Admit: 2018-10-25 | Discharge: 2018-10-25 | Disposition: A | Discharge status: Home / Self Care | Payer: 59 | Source: Ambulatory Visit | Attending: Neurology | Admitting: Neurology

## 2018-10-25 ENCOUNTER — Other Ambulatory Visit: Payer: Self-pay

## 2018-10-25 DIAGNOSIS — R569 Unspecified convulsions: Secondary | ICD-10-CM

## 2018-10-25 MED ORDER — GADOBUTROL 1 MMOL/ML IV SOLN
10.0000 mL | Freq: Once | INTRAVENOUS | Status: AC | PRN
  Administered 2018-10-25: 11:00:00 10 mL via INTRAVENOUS

## 2019-03-14 ENCOUNTER — Emergency Department
Admission: EM | Admit: 2019-03-14 | Discharge: 2019-03-14 | Disposition: A | Discharge status: Home / Self Care | Payer: Worker's Compensation | Attending: Emergency Medicine | Admitting: Emergency Medicine

## 2019-03-14 ENCOUNTER — Emergency Department: Payer: Worker's Compensation

## 2019-03-14 ENCOUNTER — Encounter: Payer: Self-pay | Admitting: Emergency Medicine

## 2019-03-14 ENCOUNTER — Other Ambulatory Visit: Payer: Self-pay

## 2019-03-14 DIAGNOSIS — W2209XA Striking against other stationary object, initial encounter: Secondary | ICD-10-CM | POA: Insufficient documentation

## 2019-03-14 DIAGNOSIS — Z79899 Other long term (current) drug therapy: Secondary | ICD-10-CM | POA: Insufficient documentation

## 2019-03-14 DIAGNOSIS — Y990 Civilian activity done for income or pay: Secondary | ICD-10-CM | POA: Diagnosis not present

## 2019-03-14 DIAGNOSIS — Z23 Encounter for immunization: Secondary | ICD-10-CM | POA: Insufficient documentation

## 2019-03-14 DIAGNOSIS — Y9259 Other trade areas as the place of occurrence of the external cause: Secondary | ICD-10-CM | POA: Diagnosis not present

## 2019-03-14 DIAGNOSIS — Y9389 Activity, other specified: Secondary | ICD-10-CM | POA: Diagnosis not present

## 2019-03-14 DIAGNOSIS — S61215A Laceration without foreign body of left ring finger without damage to nail, initial encounter: Secondary | ICD-10-CM | POA: Insufficient documentation

## 2019-03-14 MED ORDER — HYDROCODONE-ACETAMINOPHEN 5-325 MG PO TABS: 1.0000 | ORAL_TABLET | ORAL | 0 refills | Status: AC | PRN

## 2019-03-14 MED ORDER — OXYCODONE-ACETAMINOPHEN 5-325 MG PO TABS
1.0000 | ORAL_TABLET | Freq: Once | ORAL | Status: AC
  Administered 2019-03-14: 1 via ORAL
  Filled 2019-03-14: qty 1

## 2019-03-14 MED ORDER — TETANUS-DIPHTH-ACELL PERTUSSIS 5-2.5-18.5 LF-MCG/0.5 IM SUSP
0.5000 mL | Freq: Once | INTRAMUSCULAR | Status: AC
  Administered 2019-03-14: 19:00:00 0.5 mL via INTRAMUSCULAR
  Filled 2019-03-14: qty 0.5

## 2019-03-14 MED ORDER — POVIDONE-IODINE 7.5 % EX SOLN
CUTANEOUS | Status: DC | PRN
  Filled 2019-03-14: qty 118

## 2019-03-14 MED ORDER — LIDOCAINE-EPINEPHRINE 1 %-1:100000 IJ SOLN
30.0000 mL | Freq: Once | INTRAMUSCULAR | Status: AC
  Administered 2019-03-14: 19:00:00 30 mL
  Filled 2019-03-14: qty 1

## 2019-03-14 MED ORDER — CEPHALEXIN 500 MG PO CAPS: 500.0000 mg | ORAL_CAPSULE | Freq: Three times a day (TID) | ORAL | 0 refills | Status: AC

## 2019-03-14 NOTE — ED Triage Notes (Signed)
Pt has laceration to right 4th digit from machine at work.   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  unknown last Tdap. Still having some bleeding. No crush injury.

## 2019-03-14 NOTE — ED Provider Notes (Signed)
[facility] EMERGENCY DEPARTMENT Provider Note   CSN: [id number] Arrival date & time: 03/14/19  1740     History Chief Complaint  Patient presents with  . Laceration    Brett DeemJohn Helzer Jr. is a 38 y.o. male presents today for evaluation of Worker's Comp. injury, laceration to the left ring finger just prior to arrival.  Patient states he works at [employer], a wheel on a machine called his hand and pushed his left ring finger up against a piece of metal suffering a laceration to the distal volar aspect of the left ring finger.  No tendon deficits noted.  No numbness or tingling.  He is at a mod amount of bleeding.  He is uncertain when his tetanus was updated last.  His pain is moderate.  Denies any other injury to his hand.  No other injury to his body.  HPI     Past Medical History:  Diagnosis Date  . Sleep apnea     There are no problems to display for this patient.   History reviewed. No pertinent surgical history.     History reviewed. No pertinent family history.  Social History   Tobacco Use  . Smoking status: Never Smoker  . Smokeless tobacco: Never Used  Substance Use Topics  . Alcohol use: Yes    Comment: occ  . Drug use: No    Home Medications Prior to Admission medications   Medication Sig Start Date End Date Taking? Authorizing Provider  cephALEXin (KEFLEX) 500 MG capsule Take 1 capsule (500 mg total) by mouth 3 (three) times daily for 7 days. 03/14/19 03/21/19  Evon SlackGaines, Eluzer Howdeshell C, PA-C  HYDROcodone-acetaminophen (NORCO) 5-325 MG tablet Take 1 tablet by mouth every 4 (four) hours as needed for moderate pain. 03/14/19   Evon SlackGaines, Chisom Aust C, PA-C  Omega-3 Fatty Acids (FISH OIL OMEGA-3 PO) Take 1 Can by mouth daily.    [provider]    Allergies    Patient has no known allergies.  Review of Systems   Review of Systems  Musculoskeletal: Positive for arthralgias.  Skin: Positive for wound. Negative for rash.    Neurological: Negative for numbness.    Physical Exam Updated Vital Signs BP (!) 149/97   Pulse 99   Temp 98.2 F (36.8 C) (Oral)   Resp (!) 24   Ht 6\' 2"  (1.88 m)   Wt (!) 158.8 kg   SpO2 96%   BMI 44.94 kg/m   Physical Exam Constitutional:      Appearance: He is well-developed.  HENT:     Head: Normocephalic and atraumatic.  Eyes:     Conjunctiva/sclera: Conjunctivae normal.  Cardiovascular:     Rate and Rhythm: Normal rate.  Pulmonary:     Effort: Pulmonary effort is normal. No respiratory distress.  Musculoskeletal:     Cervical back: Normal range of motion.     Comments: Left ring finger with V-shaped laceration to the pulp space.  No tendon deficit noted.  Bleeding noted.  Sensation intact distally.  No nail injury noted.  He is able to fully actively extend the digit.  He has full flexion of the DIP joint.  No visible or palpable foreign body  Skin:    General: Skin is warm.     Findings: No rash.  Neurological:     Mental Status: He is alert and oriented to person, place, and time.  Psychiatric:        Behavior: Behavior normal.  Thought Content: Thought content normal.     ED Results / Procedures / Treatments   Labs (all labs ordered are listed, but only abnormal results are displayed) Labs Reviewed - No data to display  EKG None  Radiology DG Finger Ring Left  Result Date: 03/14/2019 CLINICAL DATA:  Laceration. EXAM: LEFT RING FINGER 2+V COMPARISON:  None. FINDINGS: There is soft tissue swelling about the fourth digit without evidence for a radiopaque foreign body or underlying displaced fracture or dislocation. IMPRESSION: Soft tissue swelling without evidence for a radiopaque foreign body or underlying fracture. Electronically Signed   By: Constance Holster M.D.   On: 03/14/2019 19:12    Procedures .Marland KitchenLaceration Repair  Date/Time: 03/14/2019 8:25 PM Performed by: Duanne Guess, PA-C Authorized by: Duanne Guess, PA-C   Consent:     Consent obtained:  Verbal   Consent given by:  Patient   Risks discussed:  Need for additional repair   Alternatives discussed:  No treatment Anesthesia (see MAR for exact dosages):    Anesthesia method:  Topical application   Topical anesthetic:  LET Laceration details:    Location:  Finger   Finger location:  L ring finger   Length (cm):  2   Depth (mm):  3 Repair type:    Repair type:  Simple Pre-procedure details:    Preparation:  Patient was prepped and draped in usual sterile fashion Exploration:    Hemostasis achieved with:  LET and tourniquet   Wound exploration: wound explored through full range of motion     Contaminated: no   Treatment:    Area cleansed with:  Betadine and saline   Amount of cleaning:  Standard   Irrigation solution:  Sterile saline   Irrigation method:  Syringe (And soak)   Visualized foreign bodies/material removed: no   Skin repair:    Repair method:  Sutures and tissue adhesive   Suture size:  5-0   Suture material:  Nylon   Suture technique:  Simple interrupted   Number of sutures:  3 (3) Approximation:    Approximation:  Close Post-procedure details:    Dressing:  Adhesive bandage   Patient tolerance of procedure:  Tolerated well, no immediate complications Comments:     Tourniquet used for hemostasis, Dermabond applied with a bloodless field and after tourniquet removed no bleeding noted for 5 minutes after procedure.  Band-Aid applied.  2+ cap refill noted throughout the digit.   (including critical care time)  Medications Ordered in ED Medications  povidone-iodine (BETADINE) 7.5 % scrub ( Topical Given 03/14/19 1903)  Tdap (BOOSTRIX) injection 0.5 mL (0.5 mLs Intramuscular Given 03/14/19 1902)  oxyCODONE-acetaminophen (PERCOCET/ROXICET) 5-325 MG per tablet 1 tablet (1 tablet Oral Given 03/14/19 1859)  lidocaine-EPINEPHrine (XYLOCAINE W/EPI) 1 %-1:100000 (with pres) injection 30 mL (30 mLs Other Given 03/14/19 1902)    ED  Course  I have reviewed the triage vital signs and the nursing notes.  Pertinent labs & imaging results that were available during my care of the patient were reviewed by me and considered in my medical decision making (see chart for details).    MDM Rules/Calculators/A&P                      38 year old male with laceration to the left ring finger along the volar aspect of the distal phalanx.  No visible or palpable foreign body.  X-rays negative for fracture or foreign body.  Tetanus updated here in the ED and  laceration repaired with 3 sutures and little bit of Dermabond.  No active bleeding at time of discharge.  Band-Aid applied.  He is educated on wound care.  He is placed on prophylactic antibiotics and will follow-up in 8 to 10 days for suture removal.  He will follow-up with orthopedics.  He understands signs symptoms return to ED for. Final Clinical Impression(s) / ED Diagnoses Final diagnoses:  Laceration of left ring finger without foreign body without damage to nail, initial encounter    Rx / DC Orders ED Discharge Orders         Ordered    cephALEXin (KEFLEX) 500 MG capsule  3 times daily     03/14/19 2015    HYDROcodone-acetaminophen (NORCO) 5-325 MG tablet  Every 4 hours PRN     03/14/19 2015           Ronnette Juniper 03/14/19 2027    Shaune Pollack, MD 03/17/19 [phone]

## 2019-03-14 NOTE — Discharge Instructions (Signed)
Please keep laceration site clean and dry.  Keep covered during the day.  Follow-up with primary care provider walk-in clinic or orthopedics in 10 days for suture removal.

## 2019-03-14 NOTE — ED Notes (Signed)
Pt's finger soaked in iodine/water/lidocaine with epi mix per EDP Arvella Nigh instruction.

## 2019-03-14 NOTE — ED Notes (Signed)
See triage note  Presents with laceration to right 4 th finger   States he cut it at work

## 2020-02-28 IMAGING — DX DG FINGER RING 2+V*L*
3 series · 3 of 3 positions shown · non-contrast
Comparison: None.

CLINICAL DATA: Laceration.

EXAM:
LEFT RING FINGER 2+V

[finger ap]
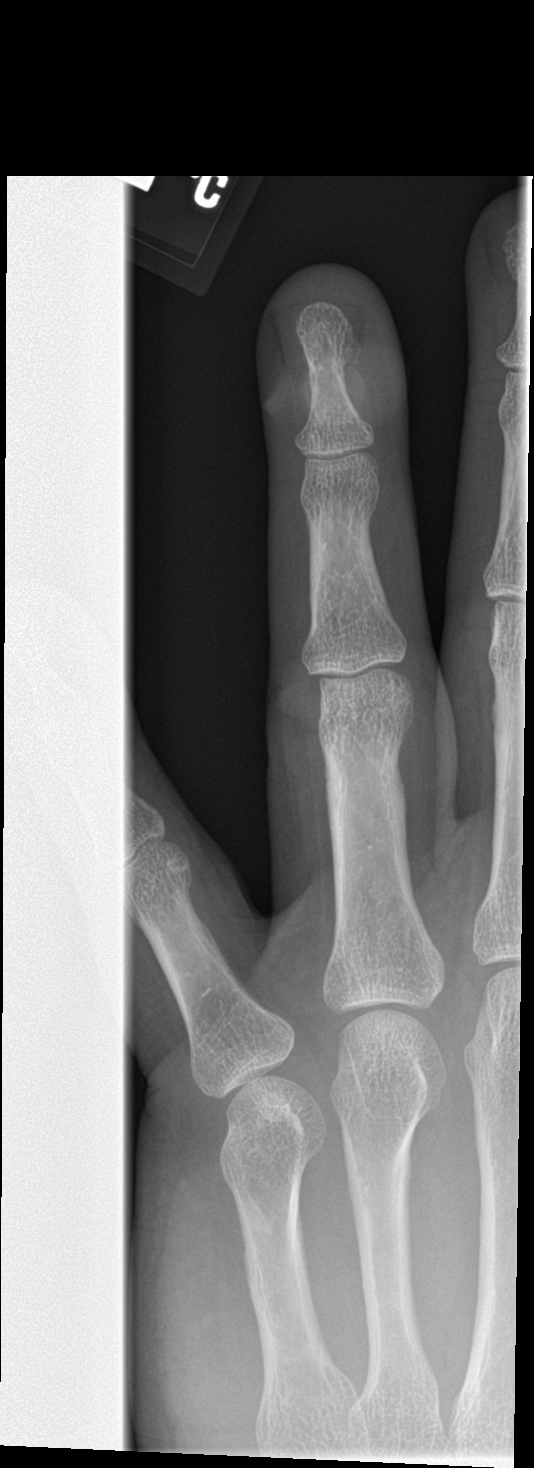

[finger obl]
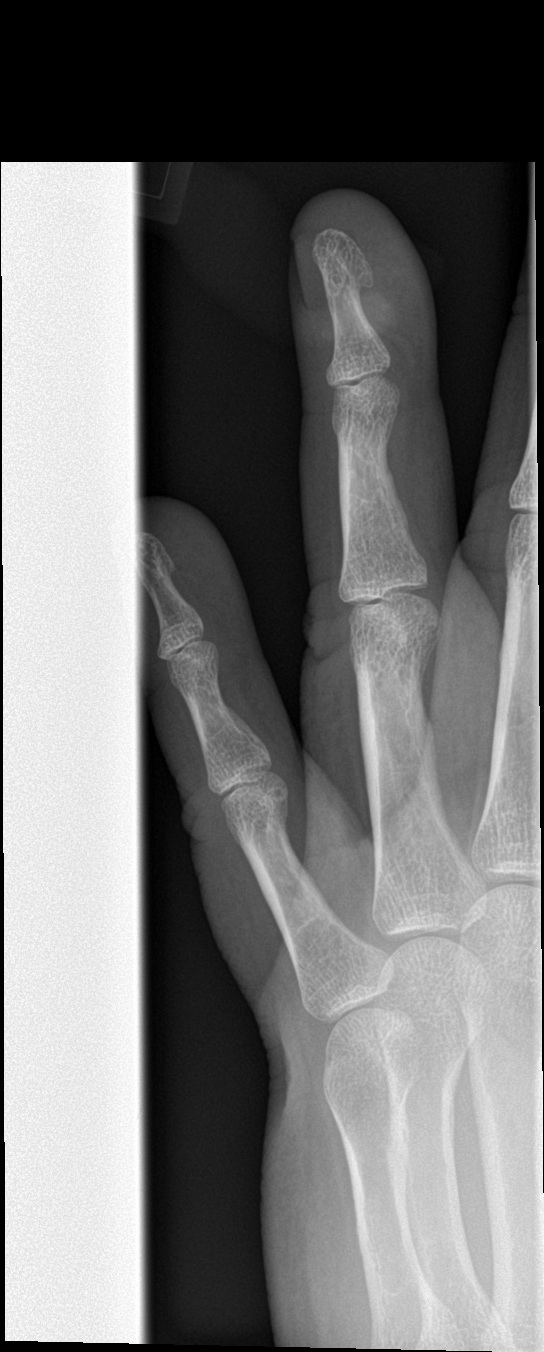

[finger lat]
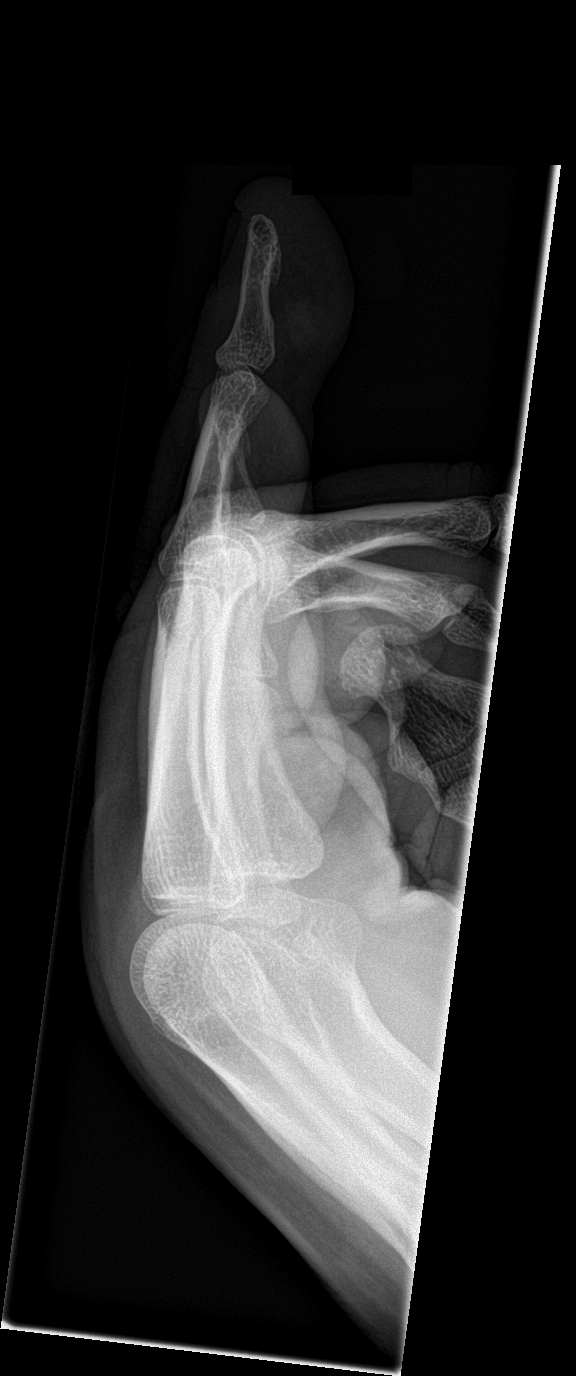

[3 of 3 positions shown; findings below may reference images not displayed]

FINDINGS: There is soft tissue swelling about the fourth digit without
evidence for a radiopaque foreign body or underlying displaced
fracture or dislocation.
IMPRESSION: Soft tissue swelling without evidence for a radiopaque foreign body
or underlying fracture.

## 2020-07-02 ENCOUNTER — Other Ambulatory Visit: Payer: Self-pay

## 2020-07-02 ENCOUNTER — Emergency Department
Admission: EM | Admit: 2020-07-02 | Discharge: 2020-07-02 | Disposition: A | Discharge status: Home / Self Care | Payer: 59 | Attending: Emergency Medicine | Admitting: Emergency Medicine

## 2020-07-02 DIAGNOSIS — K0889 Other specified disorders of teeth and supporting structures: Secondary | ICD-10-CM | POA: Insufficient documentation

## 2020-07-02 DIAGNOSIS — K029 Dental caries, unspecified: Secondary | ICD-10-CM | POA: Insufficient documentation

## 2020-07-02 MED ORDER — AMOXICILLIN 500 MG PO CAPS
500.0000 mg | ORAL_CAPSULE | Freq: Once | ORAL | Status: AC
  Administered 2020-07-02: 500 mg via ORAL
  Filled 2020-07-02: qty 1

## 2020-07-02 MED ORDER — IBUPROFEN 800 MG PO TABS: 800.0000 mg | ORAL_TABLET | Freq: Three times a day (TID) | ORAL | 0 refills | Status: AC | PRN

## 2020-07-02 MED ORDER — AMOXICILLIN 500 MG PO CAPS: 500.0000 mg | ORAL_CAPSULE | Freq: Three times a day (TID) | ORAL | 0 refills | Status: AC

## 2020-07-02 NOTE — ED Provider Notes (Signed)
[facility]-EMERGENCY DEPARTMENT  ____________________________________________  Time seen: Approximately 10:16 PM  I have reviewed the triage vital signs and the nursing notes.   HISTORY  Chief Complaint Dental Pain   Historian Patient    HPI Brett Holmes. is a 40 y.o. male presents to the emergency department with concern for dental pain along the right lower jaw.  Patient has an appointment for Wednesday.  Patient has significant dental caries of the upper and lower jaw.  He is maintaining his own secretions and has no pain underneath his tongue.  No swelling of the neck.  Patient denies fever and chills at home.  No other alleviating measures have been attempted.   Past Medical History:  Diagnosis Date  . Sleep apnea      Immunizations up to date:  Yes.     Past Medical History:  Diagnosis Date  . Sleep apnea     There are no problems to display for this patient.   History reviewed. No pertinent surgical history.  Prior to Admission medications   Medication Sig Start Date End Date Taking? Authorizing Provider  amoxicillin (AMOXIL) 500 MG capsule Take 1 capsule (500 mg total) by mouth 3 (three) times daily for 10 days. 07/02/20 07/12/20 Yes Pia Mau M, PA-C  ibuprofen (ADVIL) 800 MG tablet Take 1 tablet (800 mg total) by mouth every 8 (eight) hours as needed. 07/02/20  Yes Pia Mau M, PA-C  HYDROcodone-acetaminophen (NORCO) 5-325 MG tablet Take 1 tablet by mouth every 4 (four) hours as needed for moderate pain. 03/14/19   Evon Slack, PA-C  Omega-3 Fatty Acids (FISH OIL OMEGA-3 PO) Take 1 Can by mouth daily.    [provider]    Allergies Patient has no known allergies.  History reviewed. No pertinent family history.  Social History Social History   Tobacco Use  . Smoking status: Never Smoker  . Smokeless tobacco: Never Used  Substance Use Topics  . Alcohol use: Yes    Comment: occ  . Drug use: No     Review of Systems   Constitutional: No fever/chills Eyes:  No discharge ENT: Patient has dental pain.  Respiratory: no cough. No SOB/ use of accessory muscles to breath Gastrointestinal:   No nausea, no vomiting.  No diarrhea.  No constipation. Musculoskeletal: Negative for musculoskeletal pain. Skin: Negative for rash, abrasions, lacerations, ecchymosis.    ____________________________________________   PHYSICAL EXAM:  VITAL SIGNS: ED Triage Vitals  Enc Vitals Group     BP 07/02/20 2007 (!) 146/88     Pulse Rate 07/02/20 2007 98     Resp 07/02/20 2007 16     Temp 07/02/20 2007 98.8 F (37.1 C)     Temp Source 07/02/20 2007 Oral     SpO2 07/02/20 2007 97 %     Weight 07/02/20 2008 (!) 330 lb (149.7 kg)     Height 07/02/20 2008 6\' 2"  (1.88 m)     Head Circumference --      Peak Flow --      Pain Score 07/02/20 2008 6     Pain Loc --      Pain Edu? --      Excl. in GC? --      Constitutional: Alert and oriented. Well appearing and in no acute distress. Eyes: Conjunctivae are normal. PERRL. EOMI. Head: Atraumatic. ENT:      Ears: TMs are pearly.       Nose: No congestion/rhinnorhea.      Mouth/Throat:  Mucous membranes are moist.  Most dentition is affected by dental caries.  No pain underneath the tongue.  No swelling of the neck. Neck: No stridor.  No cervical spine tenderness to palpation. Cardiovascular: Normal rate, regular rhythm. Normal S1 and S2.  Good peripheral circulation. Respiratory: Normal respiratory effort without tachypnea or retractions. Lungs CTAB. Good air entry to the bases with no decreased or absent breath sounds Gastrointestinal: Bowel sounds x 4 quadrants. Soft and nontender to palpation. No guarding or rigidity. No distention. Musculoskeletal: Full range of motion to all extremities. No obvious deformities noted Neurologic:  Normal for age. No gross focal neurologic deficits are appreciated.  Skin:  Skin is warm, dry and intact. No rash noted. Psychiatric: Mood  and affect are normal for age. Speech and behavior are normal.   ____________________________________________   LABS (all labs ordered are listed, but only abnormal results are displayed)  Labs Reviewed - No data to display ____________________________________________  EKG   ____________________________________________  RADIOLOGY   No results found.  ____________________________________________    PROCEDURES  Procedure(s) performed:     Procedures     Medications  amoxicillin (AMOXIL) capsule 500 mg (500 mg Oral Given 07/02/20 2212)     ____________________________________________   INITIAL IMPRESSION / ASSESSMENT AND PLAN / ED COURSE  Pertinent labs & imaging results that were available during my care of the patient were reviewed by me and considered in my medical decision making (see chart for details).       Assessment and Plan:  Dental pain 40 year old male presents to the emergency department with dental pain for the past 2 to 3 days and has an appointment for Wednesday.  Patient was hypertensive at triage but other vital signs were reassuring.  He was started on amoxicillin.  An injection of Toradol was offered in the emergency department but patient declined stating that he would take ibuprofen and Tylenol at home.  Return precautions were given to return with new or worsening symptoms.  All patient questions were answered.    ____________________________________________  FINAL CLINICAL IMPRESSION(S) / ED DIAGNOSES  Final diagnoses:  Pain, dental      NEW MEDICATIONS STARTED DURING THIS VISIT:  ED Discharge Orders         Ordered    ibuprofen (ADVIL) 800 MG tablet  Every 8 hours PRN        07/02/20 2207    amoxicillin (AMOXIL) 500 MG capsule  3 times daily        07/02/20 2208              This chart was dictated using voice recognition software/Dragon. Despite best efforts to proofread, errors can occur which can change the  meaning. Any change was purely unintentional.     Orvil Feil, PA-C 07/02/20 2225    Phineas Semen, MD 07/02/20 [phone]

## 2020-07-02 NOTE — ED Triage Notes (Addendum)
Pt presents to ER c/o right lower molar pain after chipping tooth on 4/6.  Pt states he is having difficulty eating or drinking d/t pain in his tooth.  No facial swelling noted at this time.

## 2020-07-02 NOTE — Discharge Instructions (Signed)
Continue to take Ibuprofen and Tylenol for dental pain.  Take Amoxicillin three times daily for the next ten days.

## 2020-07-02 NOTE — ED Notes (Signed)
Pt reports dental pain since Wednesday reports called his dentist they don't have any openings at present reports on list to be seen if office has a cancellation. Pt reports taking OTC Bayer for pain. Pt talks in complete sentences no distress noted
